# Patient Record
Sex: Female | Born: 1945 | Race: White | Hispanic: No | Marital: Married | Smoking: Never smoker
Health system: Southern US, Community
[De-identification: ages and names within clinical notes are randomized; demographics above are authoritative.]

## PROBLEM LIST (undated history)

## (undated) DIAGNOSIS — F32A Depression, unspecified: Secondary | ICD-10-CM

## (undated) DIAGNOSIS — F419 Anxiety disorder, unspecified: Secondary | ICD-10-CM

## (undated) DIAGNOSIS — F329 Major depressive disorder, single episode, unspecified: Secondary | ICD-10-CM

## (undated) HISTORY — DX: Anxiety disorder, unspecified: F41.9

## (undated) HISTORY — DX: Depression, unspecified: F32.A

## (undated) HISTORY — DX: Major depressive disorder, single episode, unspecified: F32.9

## (undated) HISTORY — PX: ABDOMINAL HYSTERECTOMY: SHX81

---

## 1999-12-02 ENCOUNTER — Other Ambulatory Visit: Admission: RE | Admit: 1999-12-02 | Discharge: 1999-12-02 | Payer: Self-pay | Admitting: Obstetrics and Gynecology

## 1999-12-15 ENCOUNTER — Inpatient Hospital Stay (HOSPITAL_COMMUNITY): Admission: RE | Admit: 1999-12-15 | Discharge: 1999-12-16 | Payer: Self-pay | Admitting: Obstetrics and Gynecology

## 2000-12-19 ENCOUNTER — Ambulatory Visit (HOSPITAL_COMMUNITY): Admission: RE | Admit: 2000-12-19 | Discharge: 2000-12-19 | Payer: Self-pay | Admitting: Gastroenterology

## 2001-06-12 ENCOUNTER — Other Ambulatory Visit: Admission: RE | Admit: 2001-06-12 | Discharge: 2001-06-12 | Payer: Self-pay | Admitting: Obstetrics and Gynecology

## 2004-01-20 ENCOUNTER — Other Ambulatory Visit: Admission: RE | Admit: 2004-01-20 | Discharge: 2004-01-20 | Payer: Self-pay | Admitting: Obstetrics and Gynecology

## 2004-02-06 ENCOUNTER — Encounter: Admission: RE | Admit: 2004-02-06 | Discharge: 2004-02-06 | Payer: Self-pay | Admitting: Family Medicine

## 2014-02-14 ENCOUNTER — Ambulatory Visit (INDEPENDENT_AMBULATORY_CARE_PROVIDER_SITE_OTHER): Payer: Medicare HMO | Admitting: Internal Medicine

## 2014-02-14 DIAGNOSIS — A09 Infectious gastroenteritis and colitis, unspecified: Secondary | ICD-10-CM

## 2014-02-14 DIAGNOSIS — Z789 Other specified health status: Secondary | ICD-10-CM

## 2014-02-14 DIAGNOSIS — Z23 Encounter for immunization: Secondary | ICD-10-CM

## 2014-02-14 MED ORDER — ATOVAQUONE-PROGUANIL HCL 250-100 MG PO TABS: 1.0000 | ORAL_TABLET | Freq: Every day | ORAL | Status: AC

## 2014-02-14 MED ORDER — CIPROFLOXACIN HCL 500 MG PO TABS: 500.0000 mg | ORAL_TABLET | Freq: Two times a day (BID) | ORAL | Status: AC

## 2014-02-14 NOTE — Progress Notes (Signed)
  Cc: pretravel counseling for 27 day trip to [place], [place], [place], [place],  Subjective:    Patient ID: Sarah Price, female    DOB: 1946-01-08, 68 y.o.   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 Predominantly safari per her description, staying in lodges. She will end her trip with 4 days in [place]: buspirone, citalopram, alprazolam. Menest, mvi, biotin, psyllium fiber capsule  No allergies Prior vax: tetanus in 2014, pna 2014, shingles 201, flu 2014  Previous travel: [place], [place], [place], [place], [place], [place], [place], [place], [place]     Review of Systems     Objective:   Physical Exam        Assessment & Plan:  Pre-travel counseling =discussed avoidance for traveler's diarrhea, mosquito bite prevention. Overall safety.  Vaccinations for today = hep A #1, typhoid, and yellow fever  Traveler's diarrhea = gave rx for cipro  Malaria proph = gave rx for malarone.

## 2014-10-11 ENCOUNTER — Ambulatory Visit (INDEPENDENT_AMBULATORY_CARE_PROVIDER_SITE_OTHER): Payer: Medicare HMO | Admitting: Emergency Medicine

## 2014-10-11 ENCOUNTER — Ambulatory Visit (INDEPENDENT_AMBULATORY_CARE_PROVIDER_SITE_OTHER): Payer: Medicare HMO

## 2014-10-11 VITALS — BP 106/66 | HR 61 | Temp 97.4°F | Resp 16 | Ht 63.0 in | Wt 139.0 lb

## 2014-10-11 DIAGNOSIS — M25562 Pain in left knee: Secondary | ICD-10-CM

## 2014-10-11 NOTE — Patient Instructions (Signed)

## 2014-10-11 NOTE — Progress Notes (Signed)
Subjective:    Patient ID: Sarah Price, female    DOB: 1946-06-07, 67 y.o.   MRN: [id number] This chart was scribed for Arlyss Queen, MD by Cathie Hoops, ED Scribe. The patient was seen in Room 9. The patient's care was started at 12:32 PM.   10/11/2014  Chief Complaint  Patient presents with  . Knee Pain    left x 2 days     HPI HPI Comments: Sarah Price is a 68 y.o. female who presents to the [facility] complaining of new, moderate, gradually worsening left knee pain onset two days ago. Pt describes her pain as throbbing. She has associated mild swelling. Pt notes her pain is worsened with standing, ambulation, and straightening her leg. Pt denies her knee locks. Pt denies right knee pain. Pt notes she previously had an issue with the same knee many years ago that has since resolved. She attempted to ice her knee and took ibuprofen with minimal relief. She put a knee brace on her knee and found some relief. 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 Pt states she was doing yard-work during her time of injury. Pt denies redness, nausea, vomiting, fever or vomiting.   Review of Systems  Constitutional: Negative for fever and chills.  Gastrointestinal: Negative for nausea and vomiting.  Musculoskeletal: Positive for joint swelling and arthralgias.    Past Medical History  Diagnosis Date  . Anxiety   . Depression    Past Surgical History  Procedure Laterality Date  . Abdominal hysterectomy     No Known Allergies Current Outpatient Prescriptions  Medication Sig Dispense Refill  . ALPRAZolam (XANAX) 0.5 MG tablet Take 0.5 mg by mouth at bedtime as needed for anxiety.    . busPIRone (BUSPAR) 30 MG tablet Take 30 mg by mouth 2 (two) times daily.    . citalopram (CELEXA) 20 MG tablet Take 20 mg by mouth daily.    Marland Kitchen atovaquone-proguanil (MALARONE) 250-100 MG TABS Take 1 tablet by mouth daily. Start taking 2 days prior to trip, take daily on  full stomach until completed 34 tablet 0  . ciprofloxacin (CIPRO) 500 MG tablet Take 1 tablet (500 mg total) by mouth 2 (two) times daily. Take if needed if you are having 3+ loose stools per 24hr. Can stop taking if diarrhea resolves 10 tablet 0   No current facility-administered medications for this visit.       Objective:  Triage Vitals: BP 106/66 mmHg  Pulse 61  Temp(Src) 97.4 F (36.3 C) (Oral)  Resp 16  Ht 5\' 3"  (1.6 m)  Wt 139 lb (63.05 kg)  BMI 24.63 kg/m2  SpO2 98%  Physical Exam  Constitutional: She is oriented to person, place, and time. She appears well-developed and well-nourished. No distress.  HENT:  Head: Normocephalic and atraumatic.  Eyes: Conjunctivae and EOM are normal.  Neck: Neck supple. No tracheal deviation present.  Cardiovascular: Normal rate.   Pulmonary/Chest: Effort normal. No respiratory distress.  Musculoskeletal:  Left Knee: Lacks approximately 10 degrees of full extension. No laxity. Negative McMurray's.  Neurological: She is alert and oriented to person, place, and time.  Skin: Skin is warm and dry.  Psychiatric: She has a normal mood and affect. Her behavior is normal.  Nursing note and vitals reviewed.  No results found for this or any previous visit. [facility] reading (PRIMARY) by  Dr.Teigan Sahli there is a half centimeter fragment lateral side of the patella. There  are degenerative changes of the left lateral condyle. Otherwise no acute changes as seen on the knee film.     Assessment & Plan:  12:36 PM- Patient informed of current plan for treatment and evaluation and agrees with plan at this time. We'll treat with ice and Aleve twice a day.

## 2015-09-22 DIAGNOSIS — Z23 Encounter for immunization: Secondary | ICD-10-CM | POA: Diagnosis not present

## 2015-09-24 DIAGNOSIS — R69 Illness, unspecified: Secondary | ICD-10-CM | POA: Diagnosis not present

## 2015-12-11 DIAGNOSIS — J069 Acute upper respiratory infection, unspecified: Secondary | ICD-10-CM | POA: Diagnosis not present

## 2015-12-29 DIAGNOSIS — D2230 Melanocytic nevi of unspecified part of face: Secondary | ICD-10-CM | POA: Diagnosis not present

## 2015-12-29 DIAGNOSIS — M67471 Ganglion, right ankle and foot: Secondary | ICD-10-CM | POA: Diagnosis not present

## 2015-12-29 DIAGNOSIS — Z808 Family history of malignant neoplasm of other organs or systems: Secondary | ICD-10-CM | POA: Diagnosis not present

## 2015-12-29 DIAGNOSIS — Z85828 Personal history of other malignant neoplasm of skin: Secondary | ICD-10-CM | POA: Diagnosis not present

## 2015-12-29 DIAGNOSIS — L821 Other seborrheic keratosis: Secondary | ICD-10-CM | POA: Diagnosis not present

## 2015-12-29 DIAGNOSIS — Z23 Encounter for immunization: Secondary | ICD-10-CM | POA: Diagnosis not present

## 2016-02-10 DIAGNOSIS — R05 Cough: Secondary | ICD-10-CM | POA: Diagnosis not present

## 2016-02-25 DIAGNOSIS — R6884 Jaw pain: Secondary | ICD-10-CM | POA: Diagnosis not present

## 2016-03-21 DIAGNOSIS — M85851 Other specified disorders of bone density and structure, right thigh: Secondary | ICD-10-CM | POA: Diagnosis not present

## 2016-03-21 DIAGNOSIS — Z803 Family history of malignant neoplasm of breast: Secondary | ICD-10-CM | POA: Diagnosis not present

## 2016-03-21 DIAGNOSIS — Z1231 Encounter for screening mammogram for malignant neoplasm of breast: Secondary | ICD-10-CM | POA: Diagnosis not present

## 2016-05-20 DIAGNOSIS — Z1389 Encounter for screening for other disorder: Secondary | ICD-10-CM | POA: Diagnosis not present

## 2016-05-20 DIAGNOSIS — M8580 Other specified disorders of bone density and structure, unspecified site: Secondary | ICD-10-CM | POA: Diagnosis not present

## 2016-05-20 DIAGNOSIS — R69 Illness, unspecified: Secondary | ICD-10-CM | POA: Diagnosis not present

## 2016-05-20 DIAGNOSIS — Z0000 Encounter for general adult medical examination without abnormal findings: Secondary | ICD-10-CM | POA: Diagnosis not present

## 2016-08-08 DIAGNOSIS — H02402 Unspecified ptosis of left eyelid: Secondary | ICD-10-CM | POA: Diagnosis not present

## 2016-08-08 DIAGNOSIS — H5213 Myopia, bilateral: Secondary | ICD-10-CM | POA: Diagnosis not present

## 2016-08-08 DIAGNOSIS — H2513 Age-related nuclear cataract, bilateral: Secondary | ICD-10-CM | POA: Diagnosis not present

## 2016-08-31 DIAGNOSIS — Z23 Encounter for immunization: Secondary | ICD-10-CM | POA: Diagnosis not present

## 2016-09-29 DIAGNOSIS — R69 Illness, unspecified: Secondary | ICD-10-CM | POA: Diagnosis not present

## 2016-10-26 DIAGNOSIS — R69 Illness, unspecified: Secondary | ICD-10-CM | POA: Diagnosis not present

## 2017-01-03 DIAGNOSIS — Z411 Encounter for cosmetic surgery: Secondary | ICD-10-CM | POA: Diagnosis not present

## 2017-01-03 DIAGNOSIS — Z808 Family history of malignant neoplasm of other organs or systems: Secondary | ICD-10-CM | POA: Diagnosis not present

## 2017-01-03 DIAGNOSIS — Z23 Encounter for immunization: Secondary | ICD-10-CM | POA: Diagnosis not present

## 2017-01-03 DIAGNOSIS — L219 Seborrheic dermatitis, unspecified: Secondary | ICD-10-CM | POA: Diagnosis not present

## 2017-01-03 DIAGNOSIS — D2230 Melanocytic nevi of unspecified part of face: Secondary | ICD-10-CM | POA: Diagnosis not present

## 2017-01-03 DIAGNOSIS — L821 Other seborrheic keratosis: Secondary | ICD-10-CM | POA: Diagnosis not present

## 2017-01-03 DIAGNOSIS — Z85828 Personal history of other malignant neoplasm of skin: Secondary | ICD-10-CM | POA: Diagnosis not present

## 2017-04-05 DIAGNOSIS — Z803 Family history of malignant neoplasm of breast: Secondary | ICD-10-CM | POA: Diagnosis not present

## 2017-04-05 DIAGNOSIS — Z1231 Encounter for screening mammogram for malignant neoplasm of breast: Secondary | ICD-10-CM | POA: Diagnosis not present

## 2017-06-01 DIAGNOSIS — Z0000 Encounter for general adult medical examination without abnormal findings: Secondary | ICD-10-CM | POA: Diagnosis not present

## 2017-06-01 DIAGNOSIS — R69 Illness, unspecified: Secondary | ICD-10-CM | POA: Diagnosis not present

## 2017-08-16 DIAGNOSIS — H5213 Myopia, bilateral: Secondary | ICD-10-CM | POA: Diagnosis not present

## 2017-08-16 DIAGNOSIS — H2513 Age-related nuclear cataract, bilateral: Secondary | ICD-10-CM | POA: Diagnosis not present

## 2017-08-21 DIAGNOSIS — Z23 Encounter for immunization: Secondary | ICD-10-CM | POA: Diagnosis not present

## 2017-09-21 DIAGNOSIS — H2512 Age-related nuclear cataract, left eye: Secondary | ICD-10-CM | POA: Diagnosis not present

## 2017-09-21 DIAGNOSIS — H25812 Combined forms of age-related cataract, left eye: Secondary | ICD-10-CM | POA: Diagnosis not present

## 2017-10-04 DIAGNOSIS — R69 Illness, unspecified: Secondary | ICD-10-CM | POA: Diagnosis not present

## 2017-10-15 DIAGNOSIS — S76912A Strain of unspecified muscles, fascia and tendons at thigh level, left thigh, initial encounter: Secondary | ICD-10-CM | POA: Diagnosis not present

## 2017-10-25 DIAGNOSIS — R69 Illness, unspecified: Secondary | ICD-10-CM | POA: Diagnosis not present

## 2018-01-02 DIAGNOSIS — H02423 Myogenic ptosis of bilateral eyelids: Secondary | ICD-10-CM | POA: Diagnosis not present

## 2018-01-02 DIAGNOSIS — H02831 Dermatochalasis of right upper eyelid: Secondary | ICD-10-CM | POA: Diagnosis not present

## 2018-01-02 DIAGNOSIS — H02834 Dermatochalasis of left upper eyelid: Secondary | ICD-10-CM | POA: Diagnosis not present

## 2018-01-02 DIAGNOSIS — H53483 Generalized contraction of visual field, bilateral: Secondary | ICD-10-CM | POA: Diagnosis not present

## 2018-01-02 DIAGNOSIS — H02413 Mechanical ptosis of bilateral eyelids: Secondary | ICD-10-CM | POA: Diagnosis not present

## 2018-01-09 DIAGNOSIS — Z808 Family history of malignant neoplasm of other organs or systems: Secondary | ICD-10-CM | POA: Diagnosis not present

## 2018-01-09 DIAGNOSIS — Z85828 Personal history of other malignant neoplasm of skin: Secondary | ICD-10-CM | POA: Diagnosis not present

## 2018-01-09 DIAGNOSIS — D225 Melanocytic nevi of trunk: Secondary | ICD-10-CM | POA: Diagnosis not present

## 2018-01-09 DIAGNOSIS — L821 Other seborrheic keratosis: Secondary | ICD-10-CM | POA: Diagnosis not present

## 2018-01-09 DIAGNOSIS — D2230 Melanocytic nevi of unspecified part of face: Secondary | ICD-10-CM | POA: Diagnosis not present

## 2018-01-09 DIAGNOSIS — Z23 Encounter for immunization: Secondary | ICD-10-CM | POA: Diagnosis not present

## 2018-01-09 DIAGNOSIS — L219 Seborrheic dermatitis, unspecified: Secondary | ICD-10-CM | POA: Diagnosis not present

## 2018-02-12 DIAGNOSIS — H02413 Mechanical ptosis of bilateral eyelids: Secondary | ICD-10-CM | POA: Diagnosis not present

## 2018-02-12 DIAGNOSIS — H53483 Generalized contraction of visual field, bilateral: Secondary | ICD-10-CM | POA: Diagnosis not present

## 2018-02-12 DIAGNOSIS — H57813 Brow ptosis, bilateral: Secondary | ICD-10-CM | POA: Diagnosis not present

## 2018-02-12 DIAGNOSIS — H02831 Dermatochalasis of right upper eyelid: Secondary | ICD-10-CM | POA: Diagnosis not present

## 2018-02-12 DIAGNOSIS — H02834 Dermatochalasis of left upper eyelid: Secondary | ICD-10-CM | POA: Diagnosis not present

## 2018-02-12 DIAGNOSIS — H02423 Myogenic ptosis of bilateral eyelids: Secondary | ICD-10-CM | POA: Diagnosis not present

## 2018-04-09 DIAGNOSIS — Z1231 Encounter for screening mammogram for malignant neoplasm of breast: Secondary | ICD-10-CM | POA: Diagnosis not present

## 2018-05-01 DIAGNOSIS — G4482 Headache associated with sexual activity: Secondary | ICD-10-CM | POA: Diagnosis not present

## 2018-05-02 ENCOUNTER — Other Ambulatory Visit (HOSPITAL_COMMUNITY): Payer: Self-pay | Admitting: Internal Medicine

## 2018-05-02 DIAGNOSIS — G4482 Headache associated with sexual activity: Secondary | ICD-10-CM

## 2018-05-03 ENCOUNTER — Ambulatory Visit (HOSPITAL_COMMUNITY)
Admission: RE | Admit: 2018-05-03 | Discharge: 2018-05-03 | Disposition: A | Discharge status: Home / Self Care | Payer: Medicare HMO | Source: Ambulatory Visit | Attending: Internal Medicine | Admitting: Internal Medicine

## 2018-05-03 DIAGNOSIS — G4482 Headache associated with sexual activity: Secondary | ICD-10-CM | POA: Insufficient documentation

## 2018-05-03 DIAGNOSIS — Z79899 Other long term (current) drug therapy: Secondary | ICD-10-CM | POA: Insufficient documentation

## 2018-05-03 DIAGNOSIS — Z87891 Personal history of nicotine dependence: Secondary | ICD-10-CM | POA: Insufficient documentation

## 2018-05-03 DIAGNOSIS — R69 Illness, unspecified: Secondary | ICD-10-CM | POA: Diagnosis not present

## 2018-05-03 DIAGNOSIS — F329 Major depressive disorder, single episode, unspecified: Secondary | ICD-10-CM | POA: Diagnosis not present

## 2018-05-03 DIAGNOSIS — R51 Headache: Secondary | ICD-10-CM | POA: Diagnosis not present

## 2018-05-03 DIAGNOSIS — Z79890 Hormone replacement therapy (postmenopausal): Secondary | ICD-10-CM | POA: Diagnosis not present

## 2018-05-03 DIAGNOSIS — Z7951 Long term (current) use of inhaled steroids: Secondary | ICD-10-CM | POA: Diagnosis not present

## 2018-05-03 DIAGNOSIS — F419 Anxiety disorder, unspecified: Secondary | ICD-10-CM | POA: Insufficient documentation

## 2018-05-03 DIAGNOSIS — J302 Other seasonal allergic rhinitis: Secondary | ICD-10-CM | POA: Insufficient documentation

## 2018-05-03 MED ORDER — GADOBENATE DIMEGLUMINE 529 MG/ML IV SOLN
15.0000 mL | Freq: Once | INTRAVENOUS | Status: AC | PRN
  Administered 2018-05-03: 13 mL via INTRAVENOUS

## 2018-05-04 LAB — POCT I-STAT CREATININE: CREATININE: 0.9 mg/dL (ref 0.44–1.00)

## 2018-05-08 ENCOUNTER — Ambulatory Visit (HOSPITAL_COMMUNITY): Payer: Medicare HMO

## 2018-06-07 DIAGNOSIS — R69 Illness, unspecified: Secondary | ICD-10-CM | POA: Diagnosis not present

## 2018-06-07 DIAGNOSIS — Z0000 Encounter for general adult medical examination without abnormal findings: Secondary | ICD-10-CM | POA: Diagnosis not present

## 2018-06-07 DIAGNOSIS — Z1389 Encounter for screening for other disorder: Secondary | ICD-10-CM | POA: Diagnosis not present

## 2018-08-09 DIAGNOSIS — Z23 Encounter for immunization: Secondary | ICD-10-CM | POA: Diagnosis not present

## 2018-08-14 DIAGNOSIS — R69 Illness, unspecified: Secondary | ICD-10-CM | POA: Diagnosis not present

## 2018-10-03 DIAGNOSIS — R69 Illness, unspecified: Secondary | ICD-10-CM | POA: Diagnosis not present

## 2018-10-25 DIAGNOSIS — R69 Illness, unspecified: Secondary | ICD-10-CM | POA: Diagnosis not present

## 2018-10-29 DIAGNOSIS — H524 Presbyopia: Secondary | ICD-10-CM | POA: Diagnosis not present

## 2018-10-29 DIAGNOSIS — H2511 Age-related nuclear cataract, right eye: Secondary | ICD-10-CM | POA: Diagnosis not present

## 2018-10-31 DIAGNOSIS — M79601 Pain in right arm: Secondary | ICD-10-CM | POA: Diagnosis not present

## 2019-01-02 DIAGNOSIS — M542 Cervicalgia: Secondary | ICD-10-CM | POA: Diagnosis not present

## 2019-01-29 DIAGNOSIS — D2230 Melanocytic nevi of unspecified part of face: Secondary | ICD-10-CM | POA: Diagnosis not present

## 2019-01-29 DIAGNOSIS — Z85828 Personal history of other malignant neoplasm of skin: Secondary | ICD-10-CM | POA: Diagnosis not present

## 2019-01-29 DIAGNOSIS — Z808 Family history of malignant neoplasm of other organs or systems: Secondary | ICD-10-CM | POA: Diagnosis not present

## 2019-01-29 DIAGNOSIS — D225 Melanocytic nevi of trunk: Secondary | ICD-10-CM | POA: Diagnosis not present

## 2019-01-29 DIAGNOSIS — L821 Other seborrheic keratosis: Secondary | ICD-10-CM | POA: Diagnosis not present

## 2019-05-28 ENCOUNTER — Other Ambulatory Visit: Payer: Self-pay | Admitting: *Deleted

## 2019-05-28 DIAGNOSIS — Z20822 Contact with and (suspected) exposure to covid-19: Secondary | ICD-10-CM

## 2019-06-02 LAB — NOVEL CORONAVIRUS, NAA: SARS-CoV-2, NAA: NOT DETECTED

## 2019-06-12 DIAGNOSIS — Z1389 Encounter for screening for other disorder: Secondary | ICD-10-CM | POA: Diagnosis not present

## 2019-06-12 DIAGNOSIS — Z0000 Encounter for general adult medical examination without abnormal findings: Secondary | ICD-10-CM | POA: Diagnosis not present

## 2019-08-15 DIAGNOSIS — Z1231 Encounter for screening mammogram for malignant neoplasm of breast: Secondary | ICD-10-CM | POA: Diagnosis not present

## 2019-08-15 DIAGNOSIS — R29890 Loss of height: Secondary | ICD-10-CM | POA: Diagnosis not present

## 2019-08-15 DIAGNOSIS — Z8262 Family history of osteoporosis: Secondary | ICD-10-CM | POA: Diagnosis not present

## 2019-08-15 DIAGNOSIS — Z90710 Acquired absence of both cervix and uterus: Secondary | ICD-10-CM | POA: Diagnosis not present

## 2019-08-15 DIAGNOSIS — M85851 Other specified disorders of bone density and structure, right thigh: Secondary | ICD-10-CM | POA: Diagnosis not present

## 2019-08-22 DIAGNOSIS — Z23 Encounter for immunization: Secondary | ICD-10-CM | POA: Diagnosis not present

## 2019-08-25 DIAGNOSIS — J019 Acute sinusitis, unspecified: Secondary | ICD-10-CM | POA: Diagnosis not present

## 2019-09-16 DIAGNOSIS — H2511 Age-related nuclear cataract, right eye: Secondary | ICD-10-CM | POA: Diagnosis not present

## 2019-09-16 DIAGNOSIS — H5211 Myopia, right eye: Secondary | ICD-10-CM | POA: Diagnosis not present

## 2019-09-17 ENCOUNTER — Other Ambulatory Visit: Payer: Self-pay

## 2019-09-17 DIAGNOSIS — Z20822 Contact with and (suspected) exposure to covid-19: Secondary | ICD-10-CM

## 2019-09-19 LAB — NOVEL CORONAVIRUS, NAA: SARS-CoV-2, NAA: NOT DETECTED

## 2019-10-02 DIAGNOSIS — R69 Illness, unspecified: Secondary | ICD-10-CM | POA: Diagnosis not present

## 2019-10-24 DIAGNOSIS — R69 Illness, unspecified: Secondary | ICD-10-CM | POA: Diagnosis not present

## 2019-11-11 ENCOUNTER — Ambulatory Visit: Payer: Medicare HMO | Attending: Internal Medicine

## 2019-11-11 DIAGNOSIS — Z20822 Contact with and (suspected) exposure to covid-19: Secondary | ICD-10-CM

## 2019-11-11 DIAGNOSIS — Z20828 Contact with and (suspected) exposure to other viral communicable diseases: Secondary | ICD-10-CM | POA: Diagnosis not present

## 2019-11-12 LAB — NOVEL CORONAVIRUS, NAA: SARS-CoV-2, NAA: NOT DETECTED

## 2019-11-26 ENCOUNTER — Ambulatory Visit: Payer: Medicare HMO | Attending: Internal Medicine

## 2019-11-26 DIAGNOSIS — Z20822 Contact with and (suspected) exposure to covid-19: Secondary | ICD-10-CM

## 2019-11-28 LAB — NOVEL CORONAVIRUS, NAA: SARS-CoV-2, NAA: NOT DETECTED

## 2020-01-18 IMAGING — MR MR MRA HEAD W/O CM
10 of 13 series · 33 of 48 positions shown · IV contrast (Yes)
Comparison: None.

CLINICAL DATA: Severe headaches with sexual activity.

EXAM:
MRI HEAD WITHOUT AND WITH CONTRAST
MRA HEAD WITHOUT CONTRAST
TECHNIQUE: Multiplanar, multiecho pulse sequences of the brain and surrounding
structures were obtained without and with intravenous contrast.
Angiographic images of the head were obtained using MRA technique
without contrast.
CONTRAST:  13mL MULTIHANCE GADOBENATE DIMEGLUMINE 529 MG/ML IV SOLN

[Series 3: DWI · axial · 3.0mm · 1.09mm/px · z∈[-73,+84]mm · 6 of 108 slices shown (1 of 4)]
[im 1/108]
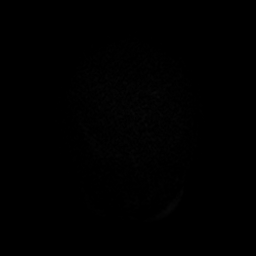
[im 22/108]
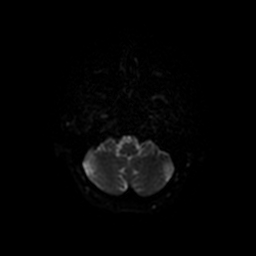
[im 43/108]
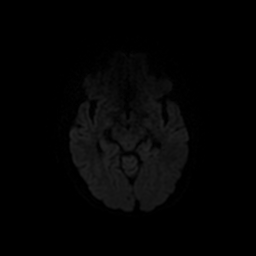
[im 65/108]
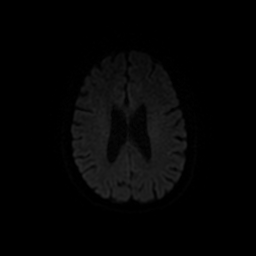
[im 86/108]
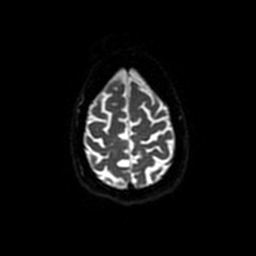
[im 108/108]
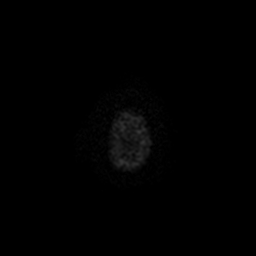

[Series 4: T1 · sagittal · 5.0mm · 0.47mm/px · 2 of 24 slices shown]
[im 1/24]
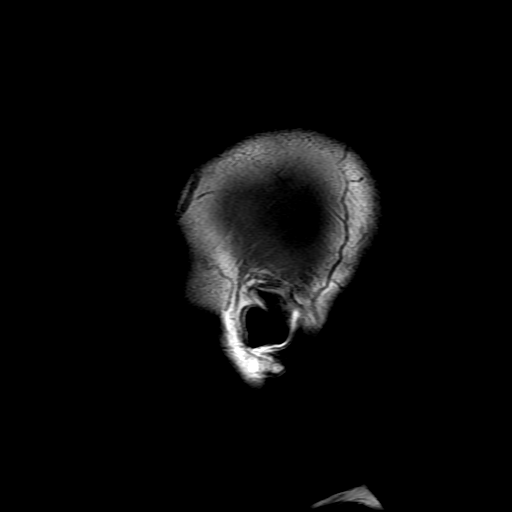
[im 24/24]
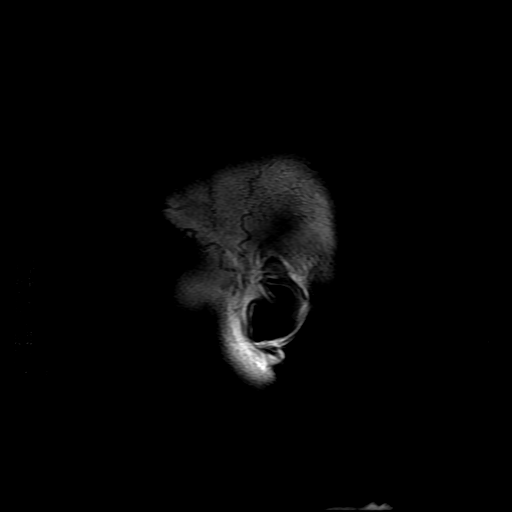

[Series 5: DWI · coronal · 3.0mm · 1.09mm/px · 8 of 120 slices shown (2 of 4)]
[im 1/120]
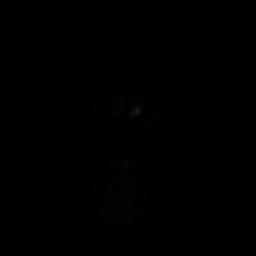
[im 18/120]
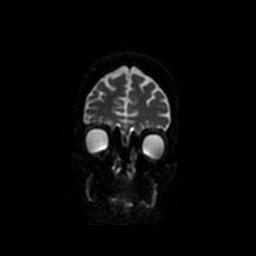
[im 35/120]
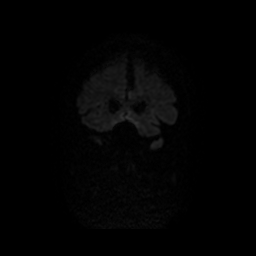
[im 52/120]
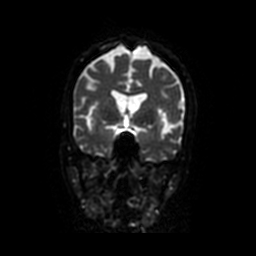
[im 69/120]
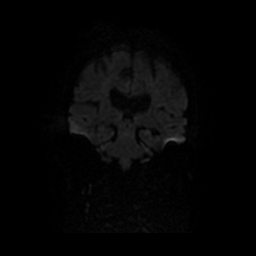
[im 86/120]
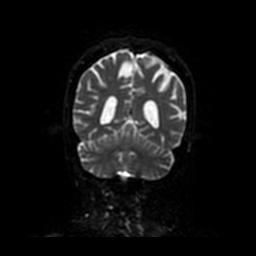
[im 103/120]
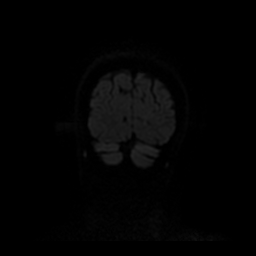
[im 120/120]
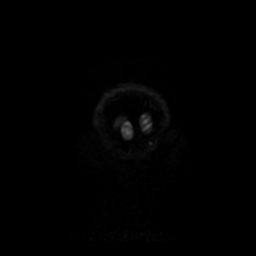

[Series 6: T2 · axial · 5.0mm · 0.43mm/px · z∈[-78,+80]mm · 2 of 24 slices shown]
[im 1/24]
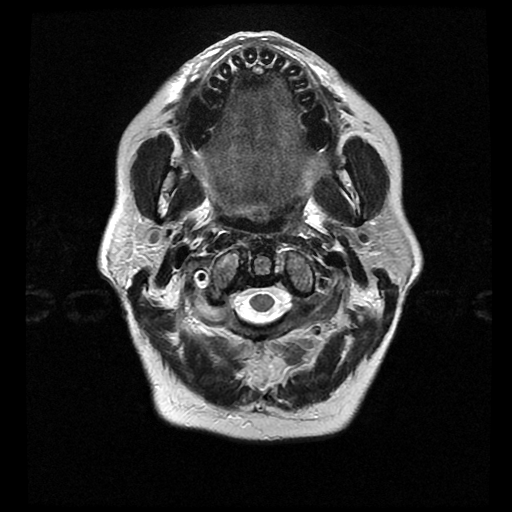
[im 24/24]
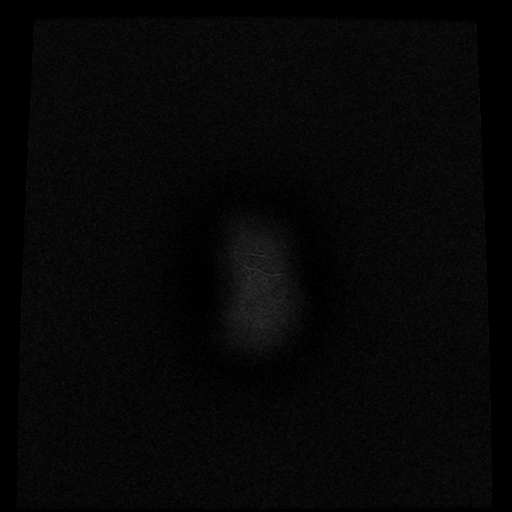

[Series 7: (id) mt fs · axial · 1.4mm · 0.39mm/px · z∈[-76,-62]mm · 2 of 144 slices shown]
[im 1/144]
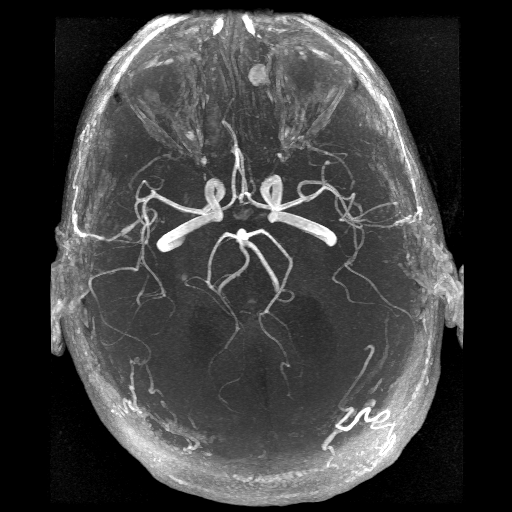
[im 18/144]
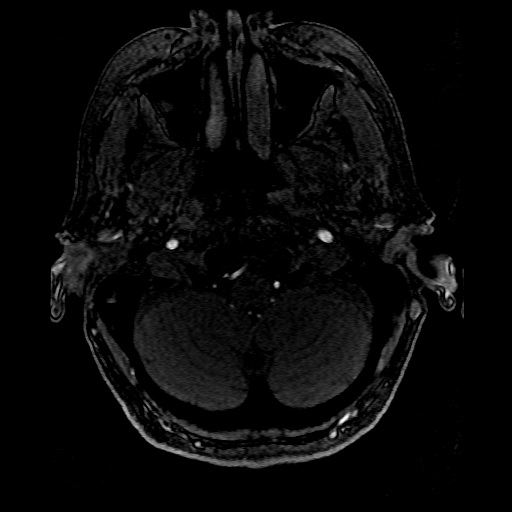

[Series 8: FLAIR · axial · 3.0mm · 0.43mm/px · z∈[-69,+78]mm · 2 of 26 slices shown]
[im 1/26]
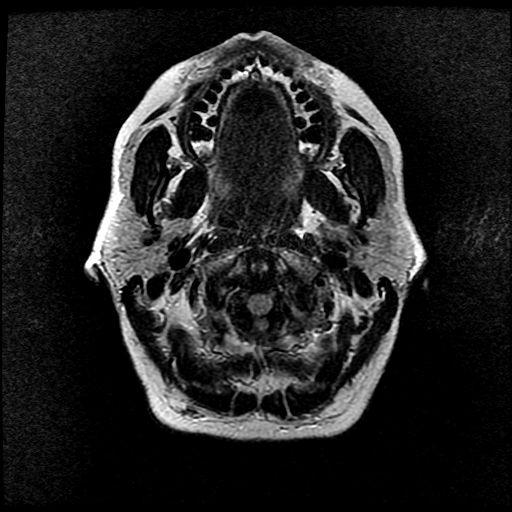
[im 26/26]
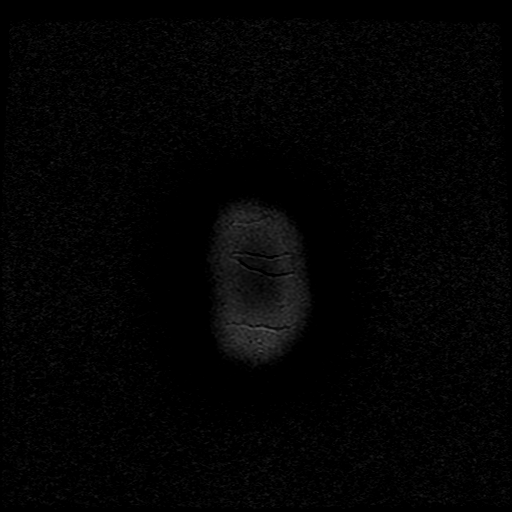

[Series 11: T2 post-contrast · coronal · 5.0mm · 0.45mm/px · 2 of 29 slices shown]
[im 1/29]
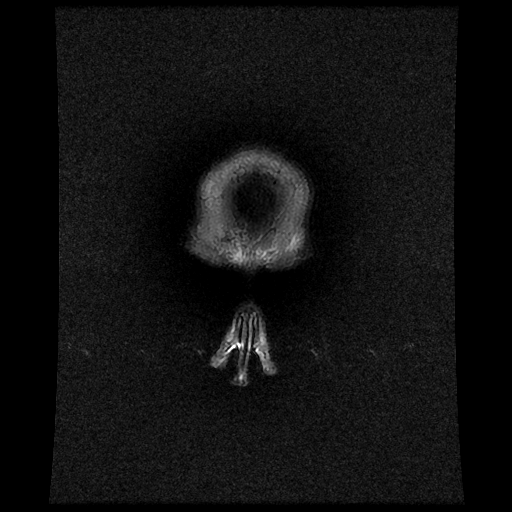
[im 29/29]
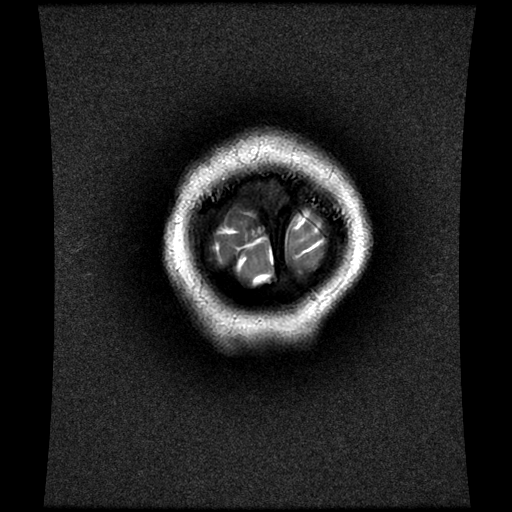

[Series 13: T1 post-contrast · coronal · 5.0mm · 0.45mm/px · 2 of 29 slices shown]
[im 1/29]
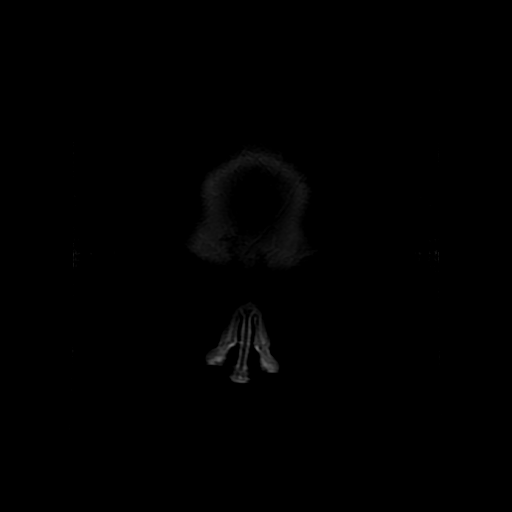
[im 29/29]
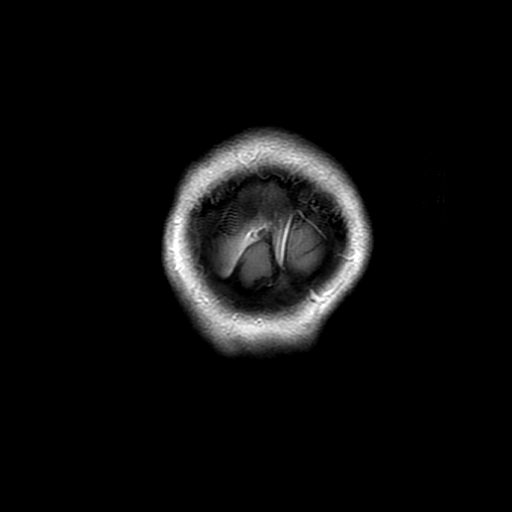

[Series 300: DWI · axial · 3.0mm · 1.09mm/px · z∈[-73,+84]mm · 3 of 54 slices shown (3 of 4)]
[im 1/54]
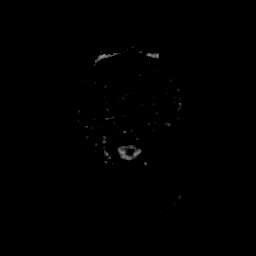
[im 27/54]
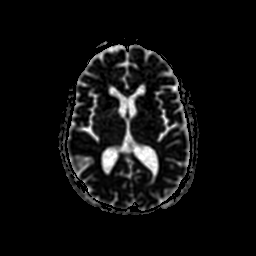
[im 54/54]
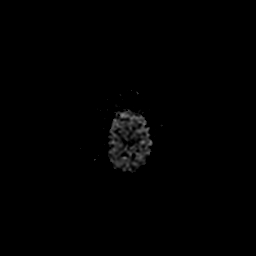

[Series 500: DWI · coronal · 3.0mm · 1.09mm/px · 4 of 60 slices shown (4 of 4)]
[im 1/60]
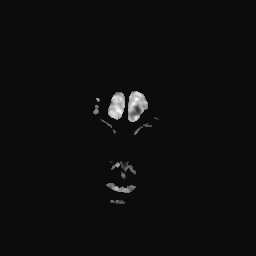
[im 20/60]
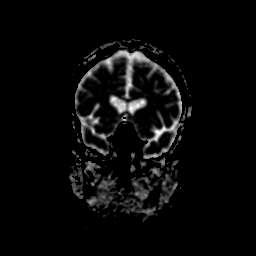
[im 40/60]
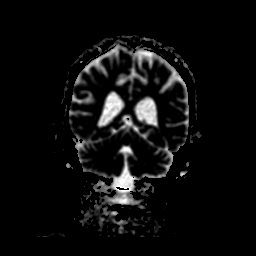
[im 60/60]
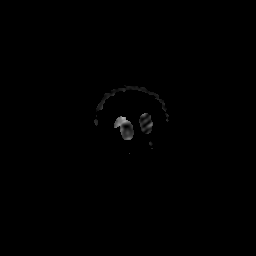

[33 of 48 positions shown; findings below may reference images not displayed]

FINDINGS: MRI HEAD FINDINGS

BRAIN: The midline structures are normal. There is no acute infarct,
acute hemorrhage or mass. Minimal white matter hyperintensity,
nonspecific and commonly seen in asymptomatic patients of this age.
The CSF spaces are normal for age, with no hydrocephalus. There are
prominent bilateral perivascular spaces of the lentiform nuclei.
Blood-sensitive sequences show no chronic microhemorrhage or
superficial siderosis.

SKULL AND UPPER CERVICAL SPINE: The visualized skull base,
calvarium, upper cervical spine and extracranial soft tissues are
normal.

SINUSES/ORBITS: No fluid levels or advanced mucosal thickening. No
mastoid or middle ear effusion. The orbits are normal.

MRA HEAD FINDINGS

Intracranial internal carotid arteries: Normal.

Anterior cerebral arteries: Normal.

Middle cerebral arteries: Normal.

Posterior communicating arteries: Present bilaterally.

Posterior cerebral arteries: Normal.

Basilar artery: Normal.

Vertebral arteries: Left dominant. Normal.

Superior cerebellar arteries: Normal.

Anterior inferior cerebellar arteries: Normal.

Posterior inferior cerebellar arteries: Normal.
IMPRESSION: 1. Normal aging brain.
2. Normal MRA of the head.

## 2020-01-28 DIAGNOSIS — D2230 Melanocytic nevi of unspecified part of face: Secondary | ICD-10-CM | POA: Diagnosis not present

## 2020-01-28 DIAGNOSIS — D225 Melanocytic nevi of trunk: Secondary | ICD-10-CM | POA: Diagnosis not present

## 2020-01-28 DIAGNOSIS — L308 Other specified dermatitis: Secondary | ICD-10-CM | POA: Diagnosis not present

## 2020-01-28 DIAGNOSIS — Z85828 Personal history of other malignant neoplasm of skin: Secondary | ICD-10-CM | POA: Diagnosis not present

## 2020-01-28 DIAGNOSIS — L821 Other seborrheic keratosis: Secondary | ICD-10-CM | POA: Diagnosis not present

## 2020-01-28 DIAGNOSIS — Z808 Family history of malignant neoplasm of other organs or systems: Secondary | ICD-10-CM | POA: Diagnosis not present

## 2020-06-04 DIAGNOSIS — Z79899 Other long term (current) drug therapy: Secondary | ICD-10-CM | POA: Diagnosis not present

## 2020-06-04 DIAGNOSIS — K259 Gastric ulcer, unspecified as acute or chronic, without hemorrhage or perforation: Secondary | ICD-10-CM | POA: Diagnosis not present

## 2020-06-04 DIAGNOSIS — K921 Melena: Secondary | ICD-10-CM | POA: Diagnosis not present

## 2020-06-04 DIAGNOSIS — Z79890 Hormone replacement therapy (postmenopausal): Secondary | ICD-10-CM | POA: Diagnosis not present

## 2020-06-04 DIAGNOSIS — Z20822 Contact with and (suspected) exposure to covid-19: Secondary | ICD-10-CM | POA: Diagnosis not present

## 2020-06-04 DIAGNOSIS — F329 Major depressive disorder, single episode, unspecified: Secondary | ICD-10-CM | POA: Diagnosis not present

## 2020-06-04 DIAGNOSIS — F419 Anxiety disorder, unspecified: Secondary | ICD-10-CM | POA: Diagnosis not present

## 2020-06-04 DIAGNOSIS — R1013 Epigastric pain: Secondary | ICD-10-CM | POA: Diagnosis not present

## 2020-06-04 DIAGNOSIS — K922 Gastrointestinal hemorrhage, unspecified: Secondary | ICD-10-CM | POA: Diagnosis not present

## 2020-06-04 DIAGNOSIS — R69 Illness, unspecified: Secondary | ICD-10-CM | POA: Diagnosis not present

## 2020-06-05 DIAGNOSIS — K922 Gastrointestinal hemorrhage, unspecified: Secondary | ICD-10-CM | POA: Diagnosis not present

## 2020-06-05 DIAGNOSIS — K253 Acute gastric ulcer without hemorrhage or perforation: Secondary | ICD-10-CM | POA: Diagnosis not present

## 2020-06-05 DIAGNOSIS — K259 Gastric ulcer, unspecified as acute or chronic, without hemorrhage or perforation: Secondary | ICD-10-CM | POA: Diagnosis not present

## 2020-06-05 DIAGNOSIS — R69 Illness, unspecified: Secondary | ICD-10-CM | POA: Diagnosis not present

## 2020-06-05 DIAGNOSIS — K921 Melena: Secondary | ICD-10-CM | POA: Diagnosis not present

## 2020-06-06 DIAGNOSIS — R69 Illness, unspecified: Secondary | ICD-10-CM | POA: Diagnosis not present

## 2020-06-06 DIAGNOSIS — K922 Gastrointestinal hemorrhage, unspecified: Secondary | ICD-10-CM | POA: Diagnosis not present

## 2020-06-10 DIAGNOSIS — K921 Melena: Secondary | ICD-10-CM | POA: Diagnosis not present

## 2020-06-10 DIAGNOSIS — Z136 Encounter for screening for cardiovascular disorders: Secondary | ICD-10-CM | POA: Diagnosis not present

## 2020-06-10 DIAGNOSIS — M85859 Other specified disorders of bone density and structure, unspecified thigh: Secondary | ICD-10-CM | POA: Diagnosis not present

## 2020-06-12 DIAGNOSIS — R69 Illness, unspecified: Secondary | ICD-10-CM | POA: Diagnosis not present

## 2020-06-12 DIAGNOSIS — K922 Gastrointestinal hemorrhage, unspecified: Secondary | ICD-10-CM | POA: Diagnosis not present

## 2020-06-30 DIAGNOSIS — R5383 Other fatigue: Secondary | ICD-10-CM | POA: Diagnosis not present

## 2020-06-30 DIAGNOSIS — Z03818 Encounter for observation for suspected exposure to other biological agents ruled out: Secondary | ICD-10-CM | POA: Diagnosis not present

## 2020-07-08 DIAGNOSIS — D500 Iron deficiency anemia secondary to blood loss (chronic): Secondary | ICD-10-CM | POA: Diagnosis not present

## 2020-07-08 DIAGNOSIS — R5383 Other fatigue: Secondary | ICD-10-CM | POA: Diagnosis not present

## 2020-07-09 DIAGNOSIS — Z0000 Encounter for general adult medical examination without abnormal findings: Secondary | ICD-10-CM | POA: Diagnosis not present

## 2020-07-22 DIAGNOSIS — Z1231 Encounter for screening mammogram for malignant neoplasm of breast: Secondary | ICD-10-CM | POA: Diagnosis not present

## 2020-07-28 DIAGNOSIS — R69 Illness, unspecified: Secondary | ICD-10-CM | POA: Diagnosis not present

## 2020-07-29 DIAGNOSIS — Z1211 Encounter for screening for malignant neoplasm of colon: Secondary | ICD-10-CM | POA: Diagnosis not present

## 2020-07-29 DIAGNOSIS — K250 Acute gastric ulcer with hemorrhage: Secondary | ICD-10-CM | POA: Diagnosis not present

## 2020-08-03 DIAGNOSIS — Z1159 Encounter for screening for other viral diseases: Secondary | ICD-10-CM | POA: Diagnosis not present

## 2020-08-05 DIAGNOSIS — K259 Gastric ulcer, unspecified as acute or chronic, without hemorrhage or perforation: Secondary | ICD-10-CM | POA: Diagnosis not present

## 2020-08-05 DIAGNOSIS — K2930 Chronic superficial gastritis without bleeding: Secondary | ICD-10-CM | POA: Diagnosis not present

## 2020-08-05 DIAGNOSIS — D123 Benign neoplasm of transverse colon: Secondary | ICD-10-CM | POA: Diagnosis not present

## 2020-08-05 DIAGNOSIS — Z1211 Encounter for screening for malignant neoplasm of colon: Secondary | ICD-10-CM | POA: Diagnosis not present

## 2020-08-05 DIAGNOSIS — K449 Diaphragmatic hernia without obstruction or gangrene: Secondary | ICD-10-CM | POA: Diagnosis not present

## 2020-08-05 DIAGNOSIS — K5730 Diverticulosis of large intestine without perforation or abscess without bleeding: Secondary | ICD-10-CM | POA: Diagnosis not present

## 2020-08-05 DIAGNOSIS — K253 Acute gastric ulcer without hemorrhage or perforation: Secondary | ICD-10-CM | POA: Diagnosis not present

## 2020-08-05 DIAGNOSIS — K635 Polyp of colon: Secondary | ICD-10-CM | POA: Diagnosis not present

## 2020-08-06 DIAGNOSIS — R69 Illness, unspecified: Secondary | ICD-10-CM | POA: Diagnosis not present

## 2020-08-11 DIAGNOSIS — K635 Polyp of colon: Secondary | ICD-10-CM | POA: Diagnosis not present

## 2020-08-11 DIAGNOSIS — D123 Benign neoplasm of transverse colon: Secondary | ICD-10-CM | POA: Diagnosis not present

## 2020-08-11 DIAGNOSIS — K2930 Chronic superficial gastritis without bleeding: Secondary | ICD-10-CM | POA: Diagnosis not present

## 2020-09-02 DIAGNOSIS — M7062 Trochanteric bursitis, left hip: Secondary | ICD-10-CM | POA: Diagnosis not present

## 2020-09-04 DIAGNOSIS — M7062 Trochanteric bursitis, left hip: Secondary | ICD-10-CM | POA: Diagnosis not present

## 2020-10-06 DIAGNOSIS — R69 Illness, unspecified: Secondary | ICD-10-CM | POA: Diagnosis not present

## 2020-10-08 DIAGNOSIS — M25552 Pain in left hip: Secondary | ICD-10-CM | POA: Diagnosis not present

## 2020-10-20 DIAGNOSIS — H2511 Age-related nuclear cataract, right eye: Secondary | ICD-10-CM | POA: Diagnosis not present

## 2020-10-20 DIAGNOSIS — H5211 Myopia, right eye: Secondary | ICD-10-CM | POA: Diagnosis not present

## 2020-12-11 DIAGNOSIS — M25552 Pain in left hip: Secondary | ICD-10-CM | POA: Diagnosis not present

## 2021-02-04 DIAGNOSIS — D2230 Melanocytic nevi of unspecified part of face: Secondary | ICD-10-CM | POA: Diagnosis not present

## 2021-02-04 DIAGNOSIS — L821 Other seborrheic keratosis: Secondary | ICD-10-CM | POA: Diagnosis not present

## 2021-02-04 DIAGNOSIS — Z85828 Personal history of other malignant neoplasm of skin: Secondary | ICD-10-CM | POA: Diagnosis not present

## 2021-02-04 DIAGNOSIS — L578 Other skin changes due to chronic exposure to nonionizing radiation: Secondary | ICD-10-CM | POA: Diagnosis not present

## 2021-02-04 DIAGNOSIS — C44319 Basal cell carcinoma of skin of other parts of face: Secondary | ICD-10-CM | POA: Diagnosis not present

## 2021-02-04 DIAGNOSIS — D485 Neoplasm of uncertain behavior of skin: Secondary | ICD-10-CM | POA: Diagnosis not present

## 2021-02-04 DIAGNOSIS — D225 Melanocytic nevi of trunk: Secondary | ICD-10-CM | POA: Diagnosis not present

## 2021-03-05 DIAGNOSIS — R69 Illness, unspecified: Secondary | ICD-10-CM | POA: Diagnosis not present

## 2021-03-18 DIAGNOSIS — R69 Illness, unspecified: Secondary | ICD-10-CM | POA: Diagnosis not present

## 2021-04-07 DIAGNOSIS — C44319 Basal cell carcinoma of skin of other parts of face: Secondary | ICD-10-CM | POA: Diagnosis not present

## 2021-04-21 DIAGNOSIS — H2511 Age-related nuclear cataract, right eye: Secondary | ICD-10-CM | POA: Diagnosis not present

## 2021-04-21 DIAGNOSIS — H5213 Myopia, bilateral: Secondary | ICD-10-CM | POA: Diagnosis not present

## 2021-04-27 DIAGNOSIS — R69 Illness, unspecified: Secondary | ICD-10-CM | POA: Diagnosis not present

## 2021-05-06 DIAGNOSIS — H25811 Combined forms of age-related cataract, right eye: Secondary | ICD-10-CM | POA: Diagnosis not present

## 2021-05-06 DIAGNOSIS — H2511 Age-related nuclear cataract, right eye: Secondary | ICD-10-CM | POA: Diagnosis not present

## 2021-05-31 ENCOUNTER — Ambulatory Visit
Admission: RE | Admit: 2021-05-31 | Discharge: 2021-05-31 | Disposition: A | Discharge status: Home / Self Care | Payer: Medicare HMO | Source: Ambulatory Visit | Attending: Internal Medicine | Admitting: Internal Medicine

## 2021-05-31 ENCOUNTER — Other Ambulatory Visit: Payer: Self-pay | Admitting: Internal Medicine

## 2021-05-31 DIAGNOSIS — M7989 Other specified soft tissue disorders: Secondary | ICD-10-CM | POA: Diagnosis not present

## 2021-05-31 DIAGNOSIS — M79641 Pain in right hand: Secondary | ICD-10-CM | POA: Diagnosis not present

## 2021-05-31 DIAGNOSIS — R52 Pain, unspecified: Secondary | ICD-10-CM

## 2021-07-15 DIAGNOSIS — Z1389 Encounter for screening for other disorder: Secondary | ICD-10-CM | POA: Diagnosis not present

## 2021-07-15 DIAGNOSIS — Z0000 Encounter for general adult medical examination without abnormal findings: Secondary | ICD-10-CM | POA: Diagnosis not present

## 2021-07-15 DIAGNOSIS — F419 Anxiety disorder, unspecified: Secondary | ICD-10-CM | POA: Diagnosis not present

## 2021-07-15 DIAGNOSIS — R69 Illness, unspecified: Secondary | ICD-10-CM | POA: Diagnosis not present

## 2021-07-15 DIAGNOSIS — Z8616 Personal history of COVID-19: Secondary | ICD-10-CM | POA: Diagnosis not present

## 2021-07-21 DIAGNOSIS — L905 Scar conditions and fibrosis of skin: Secondary | ICD-10-CM | POA: Diagnosis not present

## 2021-07-21 DIAGNOSIS — Z85828 Personal history of other malignant neoplasm of skin: Secondary | ICD-10-CM | POA: Diagnosis not present

## 2021-07-21 DIAGNOSIS — L708 Other acne: Secondary | ICD-10-CM | POA: Diagnosis not present

## 2021-07-28 DIAGNOSIS — Z1231 Encounter for screening mammogram for malignant neoplasm of breast: Secondary | ICD-10-CM | POA: Diagnosis not present

## 2021-08-16 DIAGNOSIS — K120 Recurrent oral aphthae: Secondary | ICD-10-CM | POA: Diagnosis not present

## 2021-12-28 DIAGNOSIS — R69 Illness, unspecified: Secondary | ICD-10-CM | POA: Diagnosis not present

## 2021-12-28 DIAGNOSIS — F411 Generalized anxiety disorder: Secondary | ICD-10-CM | POA: Diagnosis not present

## 2022-02-09 DIAGNOSIS — D225 Melanocytic nevi of trunk: Secondary | ICD-10-CM | POA: Diagnosis not present

## 2022-02-09 DIAGNOSIS — L578 Other skin changes due to chronic exposure to nonionizing radiation: Secondary | ICD-10-CM | POA: Diagnosis not present

## 2022-02-09 DIAGNOSIS — Z808 Family history of malignant neoplasm of other organs or systems: Secondary | ICD-10-CM | POA: Diagnosis not present

## 2022-02-09 DIAGNOSIS — L821 Other seborrheic keratosis: Secondary | ICD-10-CM | POA: Diagnosis not present

## 2022-02-09 DIAGNOSIS — Z85828 Personal history of other malignant neoplasm of skin: Secondary | ICD-10-CM | POA: Diagnosis not present

## 2022-03-12 DIAGNOSIS — J018 Other acute sinusitis: Secondary | ICD-10-CM | POA: Diagnosis not present

## 2022-07-19 DIAGNOSIS — Z961 Presence of intraocular lens: Secondary | ICD-10-CM | POA: Diagnosis not present

## 2022-07-19 DIAGNOSIS — H02403 Unspecified ptosis of bilateral eyelids: Secondary | ICD-10-CM | POA: Diagnosis not present

## 2022-07-20 DIAGNOSIS — F411 Generalized anxiety disorder: Secondary | ICD-10-CM | POA: Diagnosis not present

## 2022-07-20 DIAGNOSIS — R69 Illness, unspecified: Secondary | ICD-10-CM | POA: Diagnosis not present

## 2022-07-21 DIAGNOSIS — Z1331 Encounter for screening for depression: Secondary | ICD-10-CM | POA: Diagnosis not present

## 2022-07-21 DIAGNOSIS — Z0000 Encounter for general adult medical examination without abnormal findings: Secondary | ICD-10-CM | POA: Diagnosis not present

## 2022-10-20 DIAGNOSIS — N760 Acute vaginitis: Secondary | ICD-10-CM | POA: Diagnosis not present

## 2022-10-20 DIAGNOSIS — B3731 Acute candidiasis of vulva and vagina: Secondary | ICD-10-CM | POA: Diagnosis not present

## 2022-11-07 DIAGNOSIS — J069 Acute upper respiratory infection, unspecified: Secondary | ICD-10-CM | POA: Diagnosis not present

## 2022-11-18 DIAGNOSIS — R69 Illness, unspecified: Secondary | ICD-10-CM | POA: Diagnosis not present

## 2022-11-18 DIAGNOSIS — M8580 Other specified disorders of bone density and structure, unspecified site: Secondary | ICD-10-CM | POA: Diagnosis not present

## 2022-11-18 DIAGNOSIS — M542 Cervicalgia: Secondary | ICD-10-CM | POA: Diagnosis not present

## 2022-11-18 DIAGNOSIS — D649 Anemia, unspecified: Secondary | ICD-10-CM | POA: Diagnosis not present

## 2022-11-18 DIAGNOSIS — Z5181 Encounter for therapeutic drug level monitoring: Secondary | ICD-10-CM | POA: Diagnosis not present

## 2022-11-18 DIAGNOSIS — Z0000 Encounter for general adult medical examination without abnormal findings: Secondary | ICD-10-CM | POA: Diagnosis not present

## 2022-11-18 DIAGNOSIS — Z13220 Encounter for screening for lipoid disorders: Secondary | ICD-10-CM | POA: Diagnosis not present

## 2022-11-18 DIAGNOSIS — Z1329 Encounter for screening for other suspected endocrine disorder: Secondary | ICD-10-CM | POA: Diagnosis not present

## 2022-11-18 DIAGNOSIS — Z1231 Encounter for screening mammogram for malignant neoplasm of breast: Secondary | ICD-10-CM | POA: Diagnosis not present

## 2022-11-18 DIAGNOSIS — Z6827 Body mass index (BMI) 27.0-27.9, adult: Secondary | ICD-10-CM | POA: Diagnosis not present

## 2023-02-15 IMAGING — DX DG HAND COMPLETE 3+V*R*
3 series · 3 of 3 positions shown · non-contrast
Comparison: None

CLINICAL DATA: RIGHT hand pain with swelling and some joints, new
onset

EXAM:
RIGHT HAND - COMPLETE 3+ VIEW

[dg hand complete right (1 of 3)]
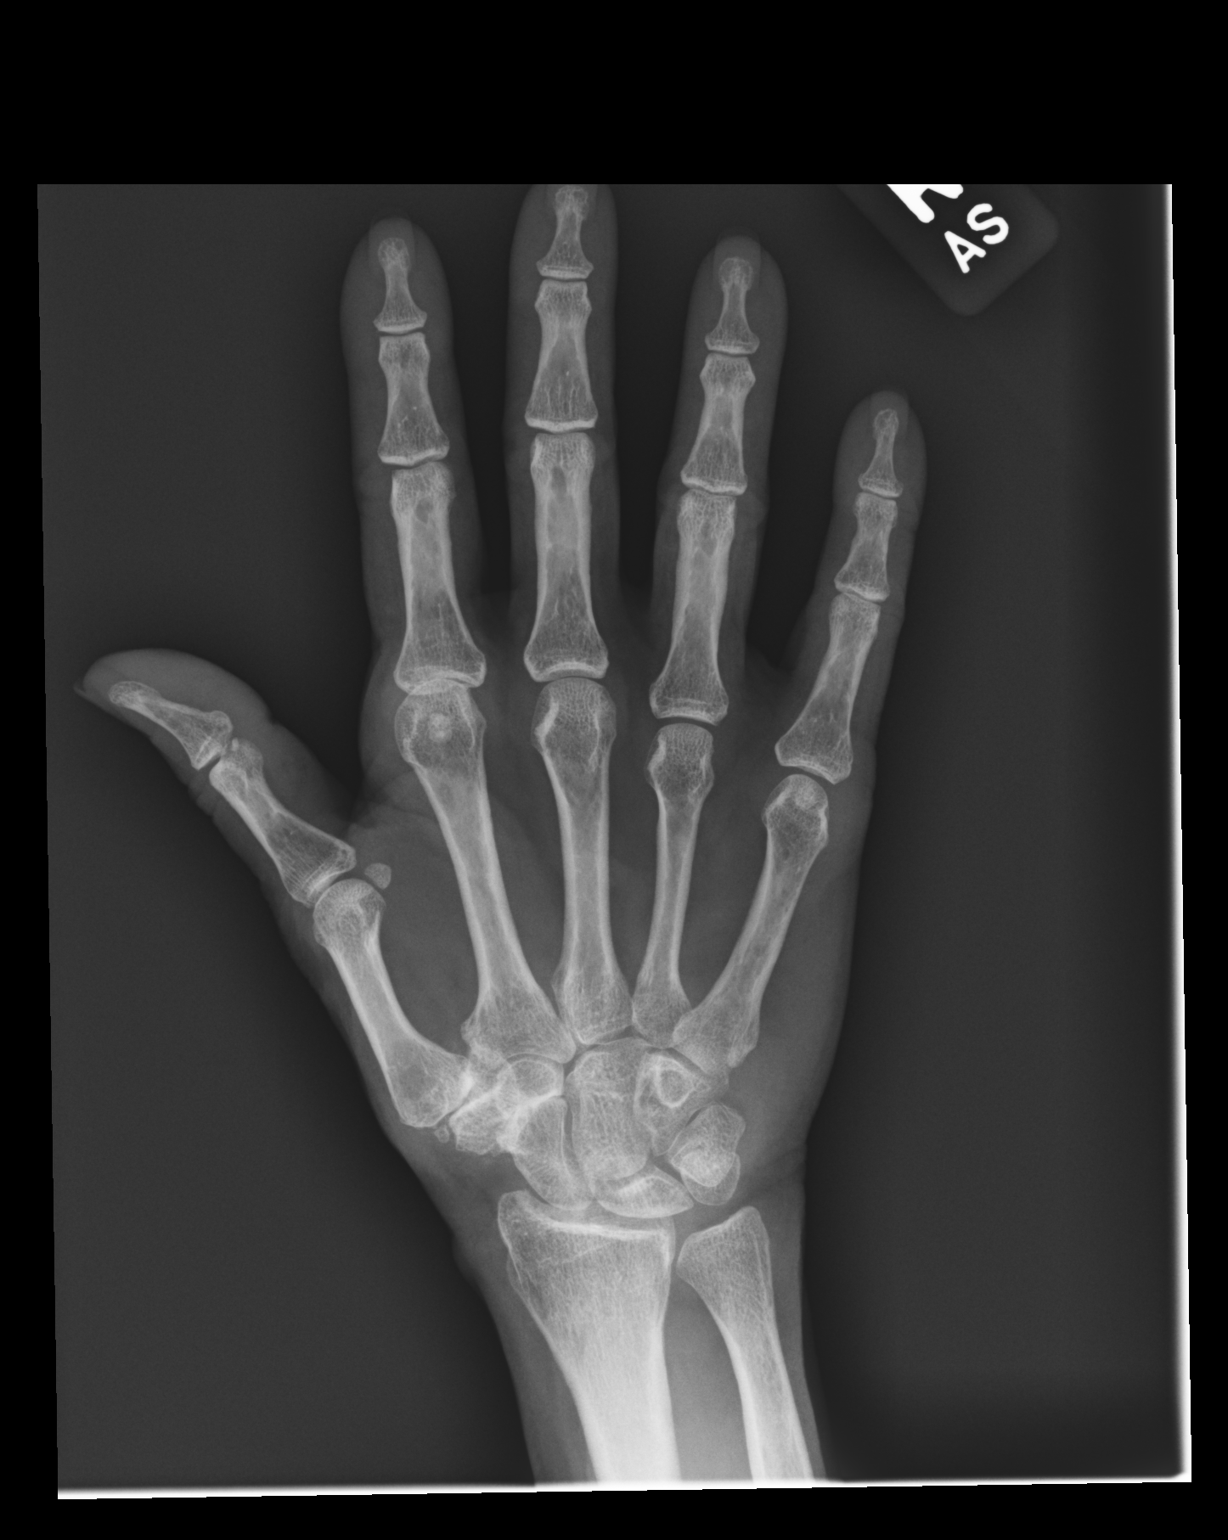

[dg hand complete right (2 of 3)]
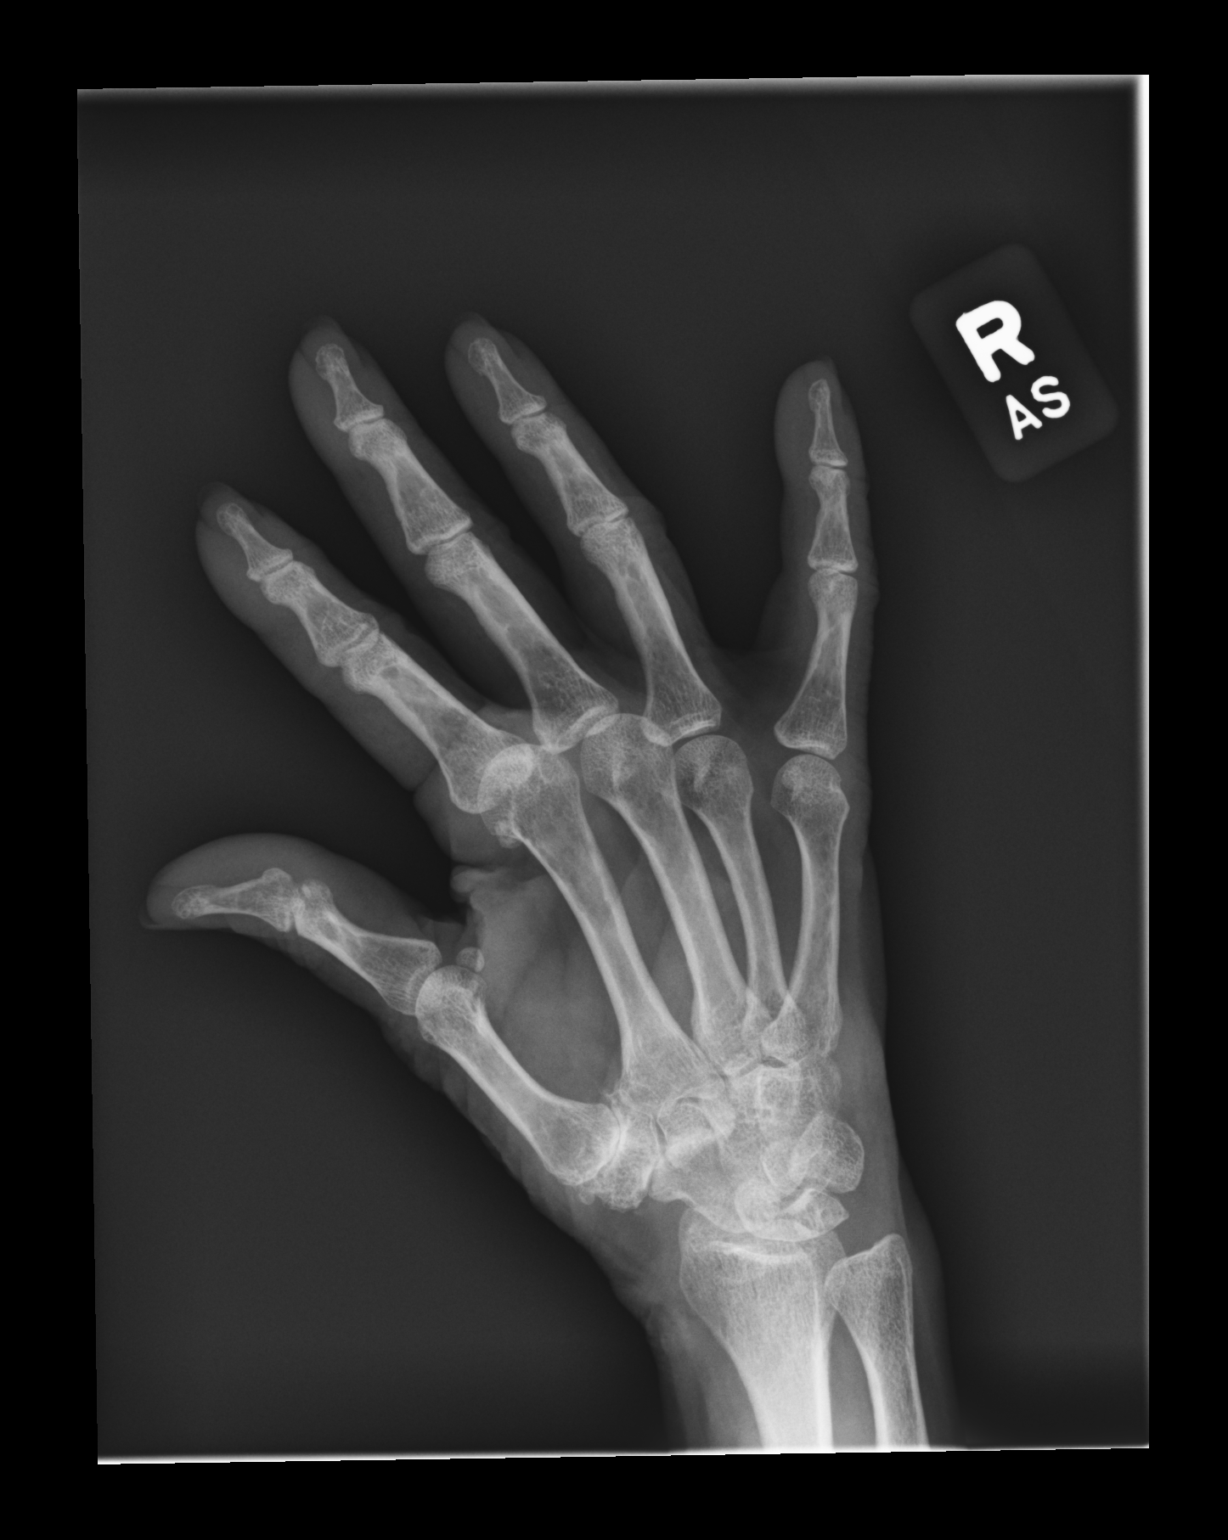

[dg hand complete right (3 of 3)]
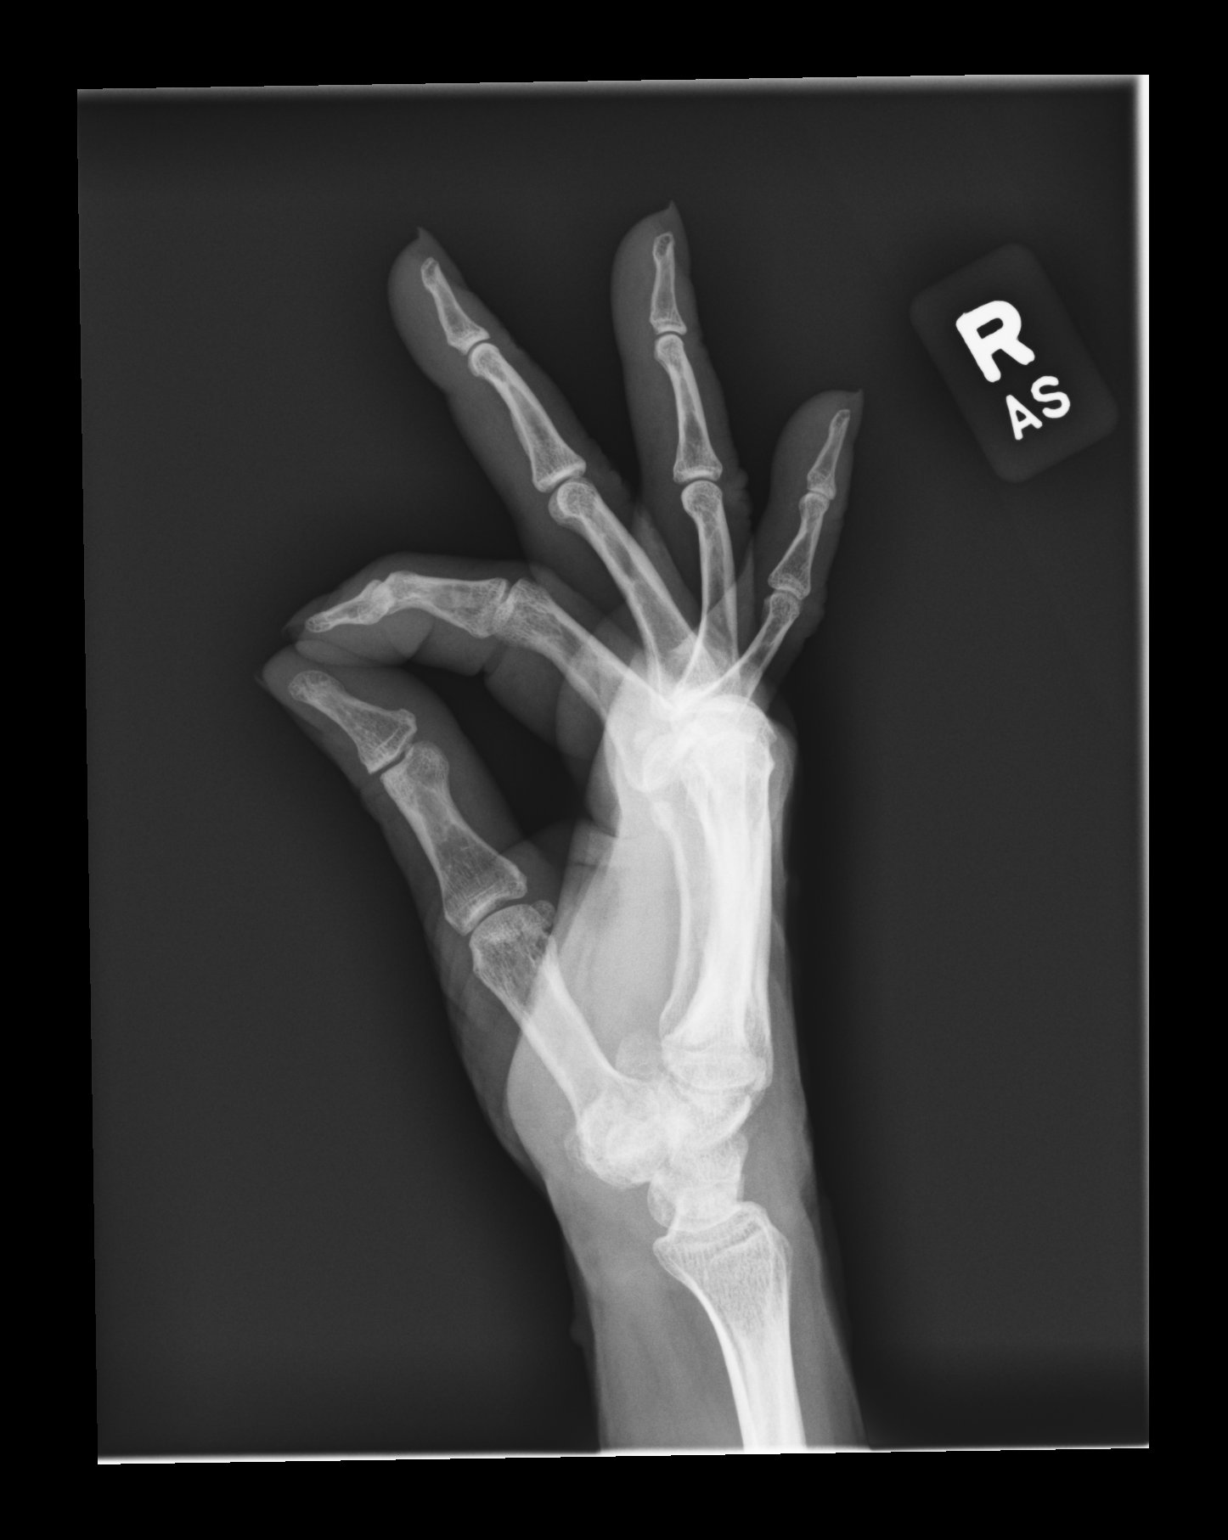

[3 of 3 positions shown; findings below may reference images not displayed]

FINDINGS: Osseous demineralization.

Soft tissue swelling at second MCP joint with joint space narrowing
and mild volar subluxation.

Joint space narrowing and spur formation at first MCP joint as well
as STT joint.

No acute fracture, dislocation, or bone destruction.

No erosive changes identified.
IMPRESSION: Degenerative changes at radial border of carpus.

Additional joint space narrowing at second MCP joint with associated
soft tissue swelling and mild volar subluxation of the proximal
phalanx.
